# Patient Record
Sex: Male | Born: 1960 | Race: White | Hispanic: No | Marital: Married | Smoking: Current every day smoker
Health system: Southern US, Community
[De-identification: ages and names within clinical notes are randomized; demographics above are authoritative.]

## PROBLEM LIST (undated history)

## (undated) HISTORY — PX: LUMBAR FUSION: SHX111

## (undated) HISTORY — PX: MULTIPLE TOOTH EXTRACTIONS: SHX2053

---

## 2002-11-27 ENCOUNTER — Encounter: Payer: Self-pay | Admitting: Specialist

## 2002-11-27 ENCOUNTER — Ambulatory Visit (HOSPITAL_COMMUNITY): Admission: RE | Admit: 2002-11-27 | Discharge: 2002-11-27 | Payer: Self-pay | Admitting: Specialist

## 2007-09-18 ENCOUNTER — Ambulatory Visit (HOSPITAL_COMMUNITY): Admission: RE | Admit: 2007-09-18 | Discharge: 2007-09-18 | Payer: Self-pay | Admitting: Orthopedic Surgery

## 2007-09-18 ENCOUNTER — Encounter (INDEPENDENT_AMBULATORY_CARE_PROVIDER_SITE_OTHER): Payer: Self-pay | Admitting: Orthopedic Surgery

## 2008-08-03 ENCOUNTER — Encounter: Admission: RE | Admit: 2008-08-03 | Discharge: 2008-08-03 | Payer: Self-pay | Admitting: Specialist

## 2008-11-17 ENCOUNTER — Inpatient Hospital Stay (HOSPITAL_COMMUNITY): Admission: RE | Admit: 2008-11-17 | Discharge: 2008-11-19 | Payer: Self-pay | Admitting: Specialist

## 2009-03-25 ENCOUNTER — Encounter: Admission: RE | Admit: 2009-03-25 | Discharge: 2009-03-25 | Payer: Self-pay | Admitting: Specialist

## 2009-04-07 ENCOUNTER — Encounter (INDEPENDENT_AMBULATORY_CARE_PROVIDER_SITE_OTHER): Payer: Self-pay | Admitting: General Surgery

## 2009-04-07 ENCOUNTER — Ambulatory Visit (HOSPITAL_COMMUNITY): Admission: RE | Admit: 2009-04-07 | Discharge: 2009-04-07 | Payer: Self-pay | Admitting: General Surgery

## 2009-08-03 ENCOUNTER — Encounter: Admission: RE | Admit: 2009-08-03 | Discharge: 2009-08-03 | Payer: Self-pay | Admitting: Specialist

## 2009-12-06 IMAGING — RF DG LUMBAR SPINE 2-3V
1 series · 2 of 2 positions shown · non-contrast
Comparison: Preoperative radiographs 11/12/2008.

CLINICAL DATA: L4-L5 PLIF.

LUMBAR SPINE - 2-3 VIEW

[Series 1: run · 2 of 2 slices shown]
[im 1/2]
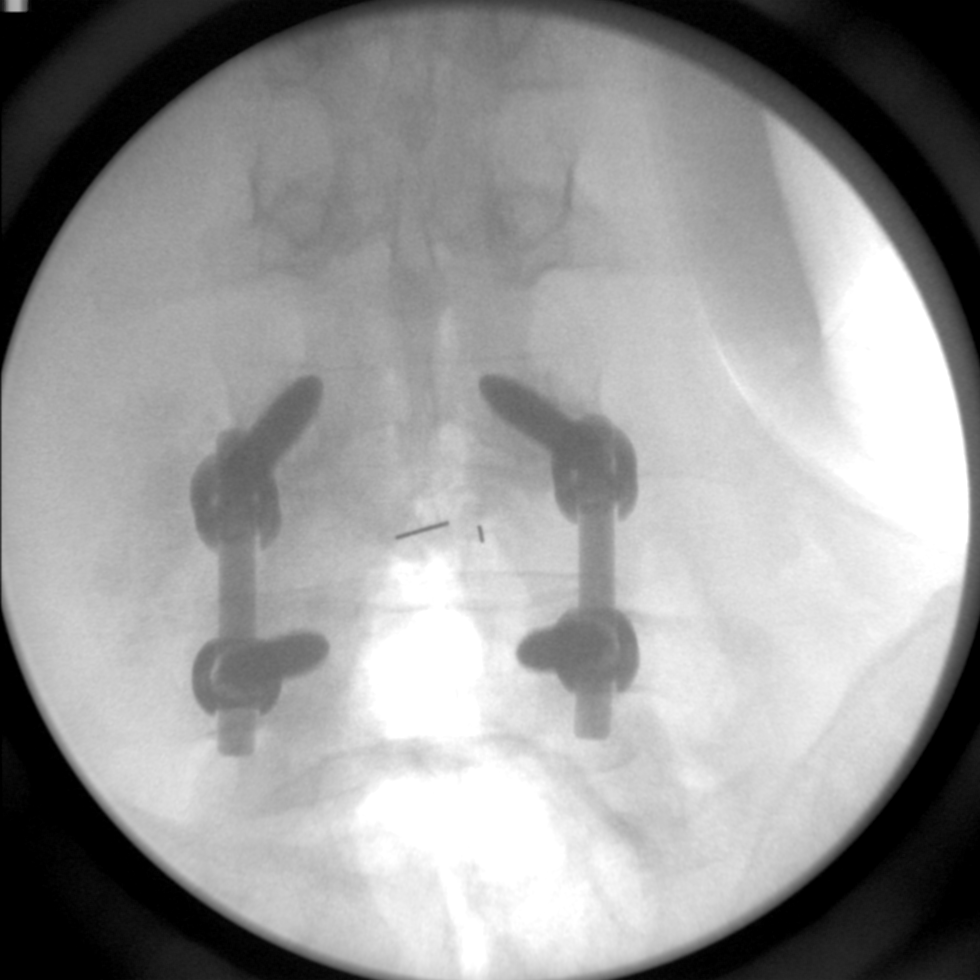
[im 2/2]
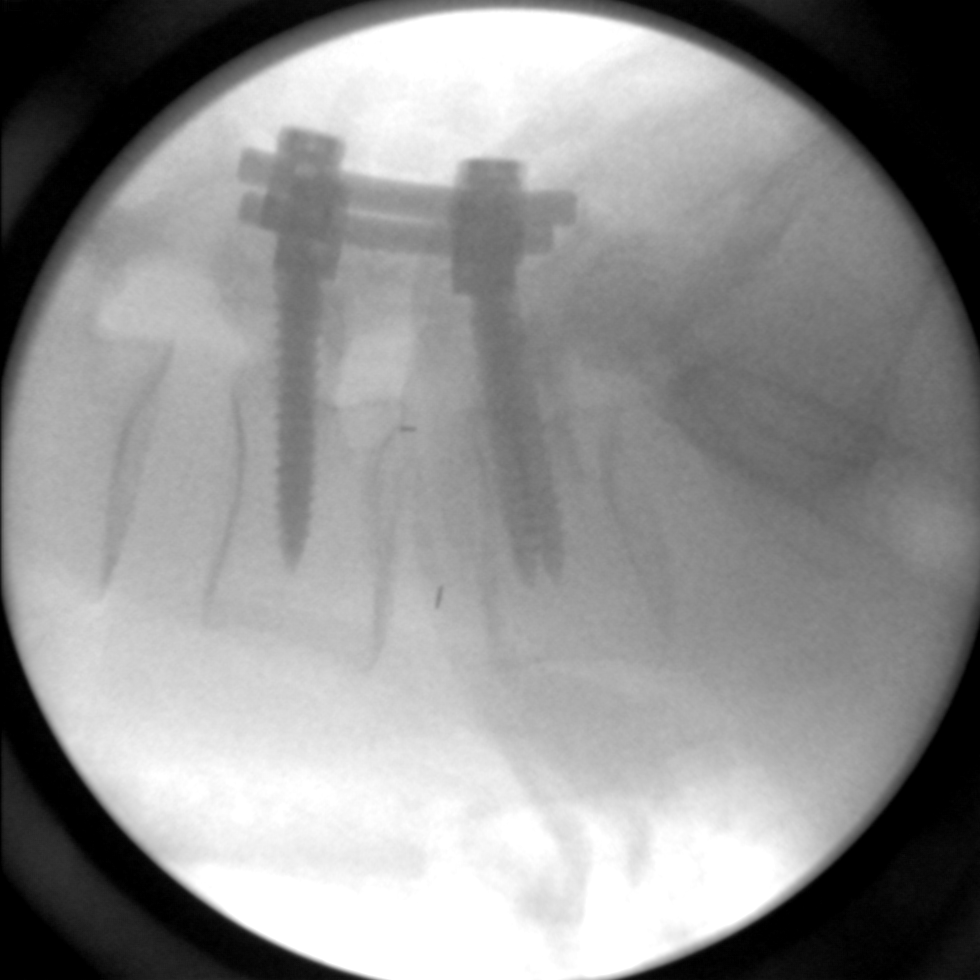

[2 of 2 positions shown; findings below may reference images not displayed]

FINDINGS: Spot PA and lateral fluoroscopic images are submitted
from the operating room.  These demonstrate posterior lumbar and
interbody fusion at L4-L5 with bilateral pedicle screws,
interconnecting rods and interbody spacers.  The hardware appears
well positioned.
IMPRESSION: Intraoperative views following L4-L5 PLIF.  No demonstrated
complication.

## 2010-12-09 LAB — DIFFERENTIAL
Basophils Absolute: 0 10*3/uL (ref 0.0–0.1)
Basophils Relative: 0 % (ref 0–1)
Neutro Abs: 5.9 10*3/uL (ref 1.7–7.7)
Neutrophils Relative %: 67 % (ref 43–77)

## 2010-12-09 LAB — BASIC METABOLIC PANEL
CO2: 23 mEq/L (ref 19–32)
Calcium: 9.6 mg/dL (ref 8.4–10.5)
Creatinine, Ser: 0.94 mg/dL (ref 0.4–1.5)
GFR calc Af Amer: >60 mL/min (ref >60)
GFR calc non Af Amer: >60 mL/min (ref >60)
Glucose, Bld: 112 mg/dL — ABNORMAL HIGH (ref 70–99)

## 2010-12-09 LAB — CBC
MCHC: 33.9 g/dL (ref 30.0–36.0)
RDW: 14.4 % (ref 11.5–15.5)

## 2010-12-14 LAB — HEMOGLOBIN AND HEMATOCRIT, BLOOD
HCT: 31.6 % — ABNORMAL LOW (ref 39.0–52.0)
HCT: 35.6 % — ABNORMAL LOW (ref 39.0–52.0)
Hemoglobin: 11.1 g/dL — ABNORMAL LOW (ref 13.0–17.0)
Hemoglobin: 12.6 g/dL — ABNORMAL LOW (ref 13.0–17.0)

## 2010-12-14 LAB — COMPREHENSIVE METABOLIC PANEL
ALT: 35 U/L (ref 0–53)
AST: 34 U/L (ref 0–37)
CO2: 28 mEq/L (ref 19–32)
Calcium: 9.6 mg/dL (ref 8.4–10.5)
Chloride: 102 mEq/L (ref 96–112)
GFR calc Af Amer: >60 mL/min (ref >60)
GFR calc non Af Amer: >60 mL/min (ref >60)
Sodium: 136 mEq/L (ref 135–145)

## 2010-12-14 LAB — GLUCOSE, CAPILLARY: Glucose-Capillary: 100 mg/dL — ABNORMAL HIGH (ref 70–99)

## 2010-12-14 LAB — DIFFERENTIAL
Eosinophils Absolute: 0.2 10*3/uL (ref 0.0–0.7)
Eosinophils Relative: 3 % (ref 0–5)
Lymphs Abs: 1.9 10*3/uL (ref 0.7–4.0)
Monocytes Relative: 8 % (ref 3–12)

## 2010-12-14 LAB — URINALYSIS, ROUTINE W REFLEX MICROSCOPIC
Glucose, UA: NEGATIVE mg/dL
Hgb urine dipstick: NEGATIVE
Specific Gravity, Urine: 1.009 (ref 1.005–1.030)
pH: 7 (ref 5.0–8.0)

## 2010-12-14 LAB — PROTIME-INR: Prothrombin Time: 13.4 seconds (ref 11.6–15.2)

## 2010-12-14 LAB — ABO/RH: ABO/RH(D): AB POS

## 2010-12-14 LAB — CBC
MCHC: 35.5 g/dL (ref 30.0–36.0)
RBC: 4.94 MIL/uL (ref 4.22–5.81)
WBC: 6.6 10*3/uL (ref 4.0–10.5)

## 2010-12-14 LAB — TYPE AND SCREEN
ABO/RH(D): AB POS
Antibody Screen: NEGATIVE

## 2011-01-16 NOTE — Op Note (Signed)
[name], [name]              ACCOUNT NO.:  [id number]   MEDICAL RECORD NO.:  [id number]          PATIENT TYPE:  AMB   LOCATION:  DAY                          FACILITY:  [facility]   PHYSICIAN:  Marlowe Kays, M.D.  DATE OF BIRTH:  1961-08-08   DATE OF PROCEDURE:  09/18/2007  DATE OF DISCHARGE:                               OPERATIVE REPORT   PREOPERATIVE DIAGNOSIS:  Herniated nucleus pulposus L4-L5, left.   POSTOPERATIVE DIAGNOSIS:  1. Herniated nucleus pulposus.  2. Lateral recess stenosis L4-L5, left.   OPERATION:  Microdiscectomy with lateral recess decompression, L4-L5,  left.   SURGEON:  Marlowe Kays, M.D.   ASSISTANT:  Georges Lynch. Darrelyn Hillock, M.D.   ANESTHESIA:  General.   JUSTIFICATION FOR PROCEDURE:  He has had an on the job injury with  persistent left leg pain.  MRI has demonstrated left lateral disc  herniation.  He has failed to respond to non-surgical treatment.  Consequently, he is here today for the above mentioned surgery.  See  description below for additional details.   DESCRIPTION OF PROCEDURE:  Prophylactic antibiotics, satisfactory  general anesthesia, knee chest position on the [place] frame.  The back  was prepped with DuraPrep and with two spinal needles, a lateral x-ray  tentatively localized the L4-L5.  We then completed draping the back in  a sterile field, Ioban employed.  Vertical midline incision centered at  where we thought L4-L5 lay.  I tagged the two spinous processes at this  level with Kocher clamps and took a second lateral x-ray confirming that  these clamps were on L4 and L5 of the L4-L5 interspace midway between.  I then began dissecting soft tissue off the neural arches of L4 and L5  and placed a self-retaining retractor.  With a small curette, I  undermined the inferior portion of L4 and superior portion of L5 and  then followed this with 3 mm Kerrison rongeurs removing bone and  ligamentum flavum.  He was quite tight laterally.   We brought in the  microscope to complete the decompression.  Most of this compression  appeared to be bone centered at the L4-L5 facet.  We removed as much  bone as was needed in order to get lateral to the L5 nerve root which we  were able to mobilize medially.  A large disc herniation was noted at  the expected level.  After coagulating associated veins with bipolar  cautery, I opened the disc with a 15 knife blade and then removed a  large amount of degenerated disc material, much of which was in large  chunks.  We removed all disc material to the left and centrally with a  combination of straight and angled upbiting pituitaries, also used  Epstein curette and nerve hook.  At the conclusion of the case, his L5  nerve root was freely mobile and the L4-L5 foramen was widely patent.  There was no unusual bleeding.  I irrigated the wound well with sterile  saline and placed Gelfoam soaked in thrombin over the interspace about  the nerve and dura.  Self-retaining retractors were removed.  There was  no unusual bleeding.  The wound was  then closed in layers with interrupted #1 Vicryl in the fascia, 2-0  Vicryl in the subcutaneous tissues, and staples in the skin.  Betadine  and Adaptic dry, sterile dressing was applied.  He tolerated the  procedure well and was taken to the recovery room in satisfactory  condition with no known complications.           ______________________________  Marlowe Kays, M.D.     JA/MEDQ  D:  09/18/2007  T:  09/18/2007  Job:  [id number]

## 2011-01-16 NOTE — H&P (Signed)
NAMESEVERUS, BRODZINSKI              ACCOUNT NO.:  [id number]   MEDICAL RECORD NO.:  [id number]           PATIENT TYPE:   LOCATION:                                 FACILITY:   PHYSICIAN:  Jene Every, M.D.    DATE OF BIRTH:  Nov 04, 1960   DATE OF ADMISSION:  DATE OF DISCHARGE:                              HISTORY & PHYSICAL   PRIMARY CARE Jaima Janney:  Dr. Feliciana Rossetti.   CHIEF COMPLAINT:  Back and left lower extremity pain.   HISTORY:  Mr. Denardo is a pleasant 50 year old gentleman who initially  was seen in our office in 2008 by Dr. Marlowe Kays secondary to a  work-related injury.  He sustained a disk herniation.  He underwent  surgical decompression by Dr. Simonne Come in January 2009.  The patient  noted no relief from the surgical intervention.  He noted persisting and  continued lower extremity pain as well as development of a back pain.  He underwent selective nerve root block with no relief.  He had seen Dr.  Otelia Sergeant in the past for a second opinion who recommended decompression  with TLIF.  He was then referred to Dr. Shelle Iron for final evaluation.  The  patient underwent continued conservative treatment, but noted persistent  disabling pain.  CT myelogram does reveal recurrent disk herniation at 4-  5, more pronounced in the left with compression of the L5 nerve root.  It was felt at this point that the patient has failed conservative  treatment and continues to have persistent disabling pain that he would  benefit from a lumbar decompression, transluminal interbody fusion,  posterior fusion at 4-5.  The risks and benefits of the surgery were  discussed with the patient.  He does wish to proceed.   MEDICAL HISTORY:  Significant for hypertension, hypercholesteremia,  anxiety, gastroesophageal reflux disease, history of pancreatitis.   CURRENT MEDICATIONS:  1. Atenolol 100 mg p.o. daily.  2. Prilosec 20 mg 1 p.o. daily.  3. Hydrocodone 10 mg 3-4 times a day.  4. Fenofibrate  134 mg capsules 1 p.o. daily.  5. Lipitor 20 mg 1 p.o. daily.  6. Xanax 0.5 mg half tab p.o. nightly p.r.n.  7. Advil p.r.n.  8. Claritin 10 mg p.o. daily.   The patient is currently using the nicotine patch.   ALLERGIES:  PENICILLIN and PREDNISONE which causes rash with difficulty  breathing.   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  He  smoked 2 packs a day for 25 years.  He has stopped smoking approximately  3 weeks ago with utilization of the patch.  In regard to alcohol  consumption, he drinks 2-6 beers per day.   PREVIOUS SURGERIES:  Microdiskectomy in 2009 by Dr. Simonne Come.   FAMILY HISTORY:  Father deceased of motor vehicle accident.  Mother is  living, history of asthma.   REVIEW OF SYSTEMS:  GENERAL:  The patient denies any fever, chills,  night sweats, or bleeding tendencies.  CNS:  No blurred or double  vision, seizure, headache, or paralysis.  RESPIRATORY:  The patient does  note  occasional wheezing, some sinus drainage.  No chest pain or  shortness of breath.  GU:  No dysuria, hematuria, discharge.  GI:  No  nausea, vomiting, diarrhea, constipation, melena, or bloody stools.  MUSCULOSKELETAL:  As per the HPI.   PHYSICAL EXAMINATION:  VITAL SIGNS:  Height is 6 feet 2 inches, weight  220, pulse is 60, respiratory 10, BP 126/80.  GENERAL:  This is a well-nourished gentleman, sitting upright in mild  distress.  He does have discomfort getting up from a seated position.  HEENT:  Atraumatic and normocephalic.  Pupils equal, round, and reactive  to light.  EOMs intact.  NECK:  Supple.  No lymphadenopathy.  CHEST:  The patient does have wheezes in bilateral bases.  HEART:  Regular rate and rhythm without murmurs, gallops, or rubs.  ABDOMEN:  Soft, nontender, nondistended.  Bowel sounds x4.  SKIN:  No rashes or lesions are noted.  BACK:  The patient has pain with forward flexion and extension of the  lumbar spine.  He is tender at the lumbosacral  junction.  Straight leg  raise on the left does produce some low back, buttock, and posterior  thigh pain.  EHL is 5-/5 and slightly decreased sensation along the L5  dermatome.   IMPRESSION:  Degenerative disk disease at L4-5 with recurrent herniated  nucleus pulposus.   PLAN:  The patient will be admitted to [facility] to undergo  decompression of 4-5, TLIF with pedicle screw instrumentation for  posterior fusion.      Roma Schanz, P.A.      Jene Every, M.D.  Electronically Signed    CS/MEDQ  D:  11/15/2008  T:  11/16/2008  Job:  [id number]

## 2011-01-16 NOTE — Op Note (Signed)
[name], [name]              ACCOUNT NO.:  [id number]   MEDICAL RECORD NO.:  [id number]          PATIENT TYPE:  AMB   LOCATION:  DAY                          FACILITY:  [facility]   PHYSICIAN:  Juanetta Gosling, MDDATE OF BIRTH:  06/05/1961   DATE OF PROCEDURE:  04/07/2009  DATE OF DISCHARGE:                               OPERATIVE REPORT   PREOPERATIVE DIAGNOSES:  Left breast mass.   POSTOPERATIVE DIAGNOSES:  Left breast mass.   PROCEDURE:  Left breast mass excisional biopsy.   SURGEON:  Dr. Harden Mo.   ASSISTANT:  None.   ANESTHESIA:  General.   SPECIMEN:  Left breast mass, to pathology.   ESTIMATED BLOOD LOSS:  Minimal.   COMPLICATIONS:  None.   DRAINS:  None.   SUPERVISING ANESTHESIOLOGIST:  Dr. Brayton Caves.   INDICATIONS:  This is a 50 year old male with about a two-month history  of a left breast/ chest wall mass that was noted on examination,  occasionally tender, with no real change in size.  This occurred after  he underwent a prone back surgery, and is somewhat temporally related to  that.  He has no other history of trauma in this area.  He underwent a  mammogram and ultrasound, read by Dr. Gordan Payment, that really was  normal, but there certainly on his examination is a palpable mass that  is either in his left breast, or on his chest wall.  He and I discussed  excisional biopsy of this.   DESCRIPTION OF PROCEDURE:  After an informed consent was given, the  patient was taken to the operating room.  He was administered 400 mg of  IV ciprofloxacin due to a PENICILLIN allergy.  Sequential compression  devices were placed on his lower extremities prior to operation.  He  then underwent a general anesthesia without complication.  His left  breast was prepped and draped in the standard sterile surgical fashion.  A surgical time-out was then performed.   About a 4 cm incision was then made overlying the mass in a radial  fashion.  Dissection was  carried out down to the level of the mass,  which was excised in its entirety.  I was unable to really determine  what this mass was in this man, of just being breast tissue.  Hemostasis  was obtained.  The deep layer was closed with 3-0 Vicryl.  The dermis  was closed with 4-0 Vicryl and the skin was closed with 4-0 Monocryl.  Steri-Strips and Dermabond were then placed over this wound.   He tolerated this well and was extubated in the operating room and was  transferred to the recovery room in stable condition.      Juanetta Gosling, MD  Electronically Signed     MCW/MEDQ  D:  04/07/2009  T:  04/07/2009  Job:  [id number]   cc:   Feliciana Rossetti, MD  Fax: [phone]

## 2011-01-16 NOTE — Op Note (Signed)
[name], [name]              ACCOUNT NO.:  [id number]   MEDICAL RECORD NO.:  [id number]          PATIENT TYPE:  INP   LOCATION:  5025                         FACILITY:  [facility]   PHYSICIAN:  Jene Every, M.D.    DATE OF BIRTH:  [birth year]   DATE OF PROCEDURE:  DATE OF DISCHARGE:                               OPERATIVE REPORT   PREOPERATIVE DIAGNOSIS:  Recurrent disk herniation, degenerative disk  disease, L4-5.   POSTOPERATIVE DIAGNOSIS:  Recurrent disk herniation, degenerative disk  disease, L4-5.   PROCEDURE PERFORMED:  1. Redo decompression, L4-5; central laminectomy; lateral recess      decompression; microdiskectomy.  2. TLIF at L4-5 utilizing a 12 Stryker Bulleted spacer with autologous      and allograft bone graft.  3. Posterior pedicle screw and rod instrumentation.  4. Lateral mass fusion, L4-5 utilizing autologous and      ___allograft_______ bone graft.  5. Continuous EMG neuromonitoring, 6 hours.   BRIEF HISTORY:  A 50 year old with recurrent disk herniation, severe  back pain, left lower extremity radicular pain, myelogram indicating  defect in the left compressing the root, a fairly tall disk with severe  back pain.  It was indicated for redo decompression and fusion.  Risks  and benefits discussed including bleeding, infection, damage to  neurovascular structures, CSF leak, and anesthetic complications.   TECHNIQUE:  The patient in the supine position.  After induction of  adequate anesthesia, 1 g vancomycin , he was placed prone on the Stryker  spine frame.  All bony prominences were well padded.  Lumbar region was  prepped and draped in usual sterile fashion.  Incision was made from the  spinous process at L3-S1.  Subcutaneous tissues were dissected  __________ dorsolumbar fascia, identified the bilateral skin incisions,  paraspinous muscle __________ at L3-4 preserving the facet at L3-4 and  L5-S1.  We identified the spinous process of L4 and L5.  The  facets of  L4 and L5 were skeletonized and de-capsulated near the interspinous  ligament and partial of the spinous process of L4 and L5.  With an  osteotome, we removed the inferior lamina of L4.  We removed it and  detached it from the epidural fibrosis by utilizing a curette.  This was  taken up to the pars at L4.  We removed the whole inferior facet on that  left side.  Then, using an osteotome, we then removed the dura  articulating a portion of L5.  We then undercut this with a 3-mm  Kerrison to the cephalad border of the pedicle and then to the medial  border of the pedicle. We used an operating microscope, draped it, and  brought into the surgical field.  We meticulously detached the epidural  fibrosis from the lateral recess, decompressed the lateral recess from  the medial border of the pedicle.  Recurrent disk herniations was noted  within the lateral recess.  It was removed with a straight pituitary.  Annulotomy was performed.  Copious amounts of disk material was removed  from the disk space and was straightened up by pituitary.  We protected  the L4 nerve root at all time.  He had continuous neuromonitoring  throughout the case.  There was no activity noted throughout the case.  On the left, a covering of soft fat tissue over the L4 nerve root and  protected the thecal sac in the nerve root at all times.   Next, we performed a full diskectomy at L4-5, curetted the endplates.  The endplates were preserved, curetted the cartilage only, and performed  the full diskectomy and irrigated the wound.  The bone graft that had  been removed was saved for autologous bone grafting.  Bone graft was  then placed in the anterior part of the disk space.  We then  sequentially trailed a Stryker PEEK spacer.  It was 12 mm in width, 30  in length.  This was found to be optimal, and this was paced with  Actifuse and cancellous bone graft.  With the neural elements well  protected, we impacted  the spacer, and the AP and lateral plane was  found to be satisfactory with a good fit.  This was countersunk to the  posterior vertebral body and nerve roots at L4 and L5.  They were widely  patent.  Bipolar electrocautery was utilized to achieve hemostasis with  thrombin-soaked Gelfoam, and at that point, bone wax.   Next, we turned our attention to placing the pedicle screws.  We  identified the transverse processes of L4 and L5 and the lateral recess  with a bone graft laterally.  Starting at L4 on the right hand side at a  point at the base of the TP and facet under x-ray, starting hole was  made and then converted with the pedicle finder.  This was observed in  AP and lateral plane and then felt __________ to be within bone.  This  was tapped and then a 45, 6.5 screw was then inserted with excellent  purchase.  This was after we curetted the TP and the pars on the side of  TP, L4 and L5.  At first, we placed on the L5 in a similar fashion,  found the pedicle, inserted the screw, placed a rod; however, the x-ray  in the AP felt it was divergent.  We removed the rod and that screw and  felt that it may have traversed out lateral to the pedicle screws.  This  was redirected and converged with excellent purchase and probing of the  ball tip, and an AP and lateral plane with an excellent purchase noted  with a 45-mm 6.75 screw.  These were tested with a pedicle testing, and  both were satisfactory, the 4 in the right at 27, the 5 on the right at  34.  A single rod was then placed between the two after next cancellous  bone graft and Actifuse placed out in the lateral recess.  On the left,  where the TLIF had been formed, we again found the pedicles at L4 and L5  by the intersection of the TP and the facet and visualized on the x-ray  again with a pedicle probe converging using C-arm augmentation. The  probe was then boned throughout and tapped it.  Then, using 6.75 screw  at the fore-end  of L5 with excellent purchase.  We used hockey stick  probe at the perimeter of L4 and L5, they were found to be widely  patent.  We then compressed the disk space on that side and used a 55  curve rod and locked  it into the pedicle screws with a set and torqued  it appropriately.  This was done on the right as well compressing disk  space.   Wound was copiously irrigated again.  Inspection of CSF leakage and a  stick probe placed at the foramen of L4-L5 and found to be widely  patent.  In the AP and lateral plane, excellent convergence.  Good  placement of the screws as well, and the spacer was satisfactory as  well.   Next, again the wound was copiously irrigated with. We packed more  Actifuse and bone graft held on the right lateral recess.  We then  placed a Hemovac and brought out through a lateral stab wound to the  skin.  We then repaired the dorsolumbar fascia with #1 Vicryl in figure-  of-eight sutures.  Subcutaneous tissue were reapproximated with 2-0  Vicryl, and skin was reapproximated with staples.  We tested a drain and  it was __________ without difficulty.  Wound was dressed with Adaptic, 4  x 4's.  The wound was dressed sterilely, and the patient was placed  supine on the hospital bed, extubated without difficulty, and  transported to the recovery room in satisfactory condition.   The patient tolerated the procedure well.  There were no complications.  Blood loss was 1000 mL.  Urine output was 350, received that through  cell saver.   Technical difficulty was increased due to the extensive epidural  fibrosis on the left, __________ a meticulous thorough decompression as  well as redirecting the pedicle screw on the right.  Also, had a fairly  tall disk at L4-5 as well.   Dr. Shon Baton was the assistant as well as Roma Schanz, PA.     Jene Every, M.D.  Electronically Signed    JB/MEDQ  D:  11/17/2008  T:  11/18/2008  Job:  [id number]

## 2011-01-19 NOTE — Discharge Summary (Signed)
[name], [name]              ACCOUNT NO.:  [id number]   MEDICAL RECORD NO.:  [id number]          PATIENT TYPE:  INP   LOCATION:  5025                         FACILITY:  [facility]   PHYSICIAN:  Jene Every, M.D.    DATE OF BIRTH:  [birth year]   DATE OF ADMISSION:  11/17/2008  DATE OF DISCHARGE:  11/19/2008                               DISCHARGE SUMMARY   ADMISSION DIAGNOSES:  1. 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  2. Hypertension.  3. Hypercholesteremia.  4. Anxiety.  5. Gastroesophageal reflux disease.  6. History of pancreatitis.   DISCHARGE DIAGNOSES:  1. 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  2. Hypertension.  3. Hypercholesteremia.  4. Anxiety.  5. Gastroesophageal reflux disease.  6. History of pancreatitis.  7. Status post redo decompression with posterolateral fusion,      transforaminal lumbar interbody fusion.  8. Alcohol withdrawal.   HISTORY:  [name] is a pleasant 50 year old gentleman, who first  presented to our office in 2008.  He was evaluated by Dr. Simonne Come for  a work-related injury.  Following that injury, he underwent surgical  decompression by Dr. Simonne Come for a HNP.  He did well for quite some  time, but then noted recurrent lower extremity pain.  He underwent  selective nerve root block with no relief of his symptoms.  He did go  for a second opinion with Dr. Otelia Sergeant, who recommended decompression with  TLIF.  He was then referred to Dr. Shelle Iron for definitive treatment.  Dr.  Shelle Iron did feel that with recurrent disk herniation and progressive back  pain, the patient would benefit from a decompression with translumbar  interbody fusion at L4-5.  The risks and benefits of the surgery were  discussed with the patient.  He does elect to proceed.   PROCEDURE:  The patient was taken to the OR on November 17, 2008, underwent  a redo decompression at L4-5 with trans lumbar  interbody fusion,  posterolateral fusion using pedicle screw instrumentation.   SURGEON:  Jene Every, MD   ASSISTANTS:  1. Roma Schanz, PA-C  2. Dahari D. Shon Baton, MD   COMPLICATIONS:  None.   ANESTHESIA:  General.   LABORATORY DATA:  Preoperative CBC shows white cell count 6.6,  hemoglobin 16.1, and hematocrit 45.5.  H&H is followed throughout the  hospital course, slightly decreased, but stable.  At the time of  discharge, hemoglobin is 11.1 and hematocrit 31.6.  Coagulation studies  done preoperatively were within normal range.  Routine chemistries done  preoperatively showed normal sodium at 136, potassium 4.8 with a glucose  of 113.  Routine liver function tests were within normal range.  Preoperative urinalysis was negative for any infection.  Blood type AB  positive.  Preop EKG showed T-wave abnormality, question lateral  ischemia.  I do not see a preoperative chest x-ray in the chart.   HOSPITAL COURSE:  The patient was admitted, taken to the OR, and  underwent the above-stated procedure without difficulty.  One Hemovac  drain was  placed intraoperatively.  The patient was transferred to the  PACU and then to the orthopedic floor for continued postoperative care.  He was placed on PCA analgesics for pain relief.  On postop day #1, the  patient did have difficulty overnight secondary to some anxiety,  question withdrawal symptoms from alcohol.  The patient had been out of  bed with minimal difficulty, he was passing flatus.  Pain was fairly  well controlled with PCA.  Vital signs were stable.  He was afebrile.  Hemoglobin was stable at 12.1.  We did remove the Hemovac with tip  intact.  Dressing was clean and dry.  Postop day #1, we discontinued the  Foley, weaned the patient off the PCA.  PT/OT was consulted.  We did  discontinue the Ativan at this time and ordered spirits of choice with  meals.  The patient will continue with nicotine patches.  We did adjust  his  pain medication from Percocet to Vicodin.  We did slowly advance his  diet and was placed on ileus precautions.  The patient did well with  physical therapy.  Unfortunately, on postop day #2, he continued to note  worsening agitation and anxiety for being in the hospitalization.  He  states he was not getting sleep.  He was anxious to be discharged.  He  did have a T-max of 100.5.  Vital signs remained stable.  Dressing was  changed.  Incision was clean and dry.  Motor neurovascular function was  intact to the lower extremity.  Calves were soft and nontender.  Abdomen  was soft and nontender.  We did discontinue the PCA as well as O2.  We  did slowly advance his diet.  Continue with Dulcolax.  Incentive  spirometer was encouraged.  We did receive a call later that day from  the patient.  Again, he was having a quite a bit of anxiety and was  ready to be discharged.  The patient was afebrile.  He had had a bowel  movement, pain was well controlled on p.o. analgesics.  Therefore, we  did elect to discharge him at that point.   DISPOSITION:  The patient was discharged home with home health PT and  OT.  He is to follow up with Dr. Shelle Iron in approximately 10-14 days for x-  ray as well as suture removal.   ACTIVITY:  He is to walk as tolerated utilizing his brace.  We discussed  back precautions.   DIET:  High fiber.   DISCHARGE MEDICATIONS:  1. Vitamin C 500 mg daily.  2. Norco 10/325 one to two p.o. q.4-6 h. p.r.n. pain.  3. Robaxin 1 p.o. q.6-8 h. p.r.n. spasm.  4. Aspirin 325 mg daily.   The patient is to not smoke.  We discussed signs of symptoms of DVT and  PE.  He is aware of this.   CONDITION ON DISCHARGE:  Stable.   FINAL DIAGNOSIS:  Doing well, status post posterior lumbar fusion at L4-  5.      Roma Schanz, P.A.      Jene Every, M.D.  Electronically Signed    CS/MEDQ  D:  12/18/2008  T:  12/18/2008  Job:  [id number]

## 2011-05-24 LAB — BASIC METABOLIC PANEL
Calcium: 9
Chloride: 103
Creatinine, Ser: 0.95
GFR calc Af Amer: >60
Sodium: 136

## 2011-05-24 LAB — HEMOGLOBIN AND HEMATOCRIT, BLOOD
HCT: 44.1
Hemoglobin: 15.6

## 2011-06-28 ENCOUNTER — Other Ambulatory Visit: Payer: Self-pay | Admitting: Gastroenterology

## 2011-06-28 DIAGNOSIS — R634 Abnormal weight loss: Secondary | ICD-10-CM

## 2011-07-03 ENCOUNTER — Ambulatory Visit
Admission: RE | Admit: 2011-07-03 | Discharge: 2011-07-03 | Disposition: A | Discharge status: Home / Self Care | Payer: BC Managed Care – PPO | Source: Ambulatory Visit | Attending: Gastroenterology | Admitting: Gastroenterology

## 2011-07-03 DIAGNOSIS — R634 Abnormal weight loss: Secondary | ICD-10-CM

## 2011-07-03 MED ORDER — IOHEXOL 300 MG/ML  SOLN
100.0000 mL | Freq: Once | INTRAMUSCULAR | Status: AC | PRN
  Administered 2011-07-03: 100 mL via INTRAVENOUS

## 2014-08-17 ENCOUNTER — Other Ambulatory Visit (HOSPITAL_COMMUNITY): Payer: Self-pay | Admitting: Oncology

## 2014-08-17 DIAGNOSIS — C8332 Diffuse large B-cell lymphoma, intrathoracic lymph nodes: Secondary | ICD-10-CM

## 2014-08-25 ENCOUNTER — Ambulatory Visit (HOSPITAL_COMMUNITY): Payer: Medicare Other

## 2014-08-25 ENCOUNTER — Encounter (HOSPITAL_COMMUNITY)
Admission: RE | Admit: 2014-08-25 | Discharge: 2014-08-25 | Disposition: A | Discharge status: Home / Self Care | Payer: Medicare Other | Source: Ambulatory Visit | Attending: Oncology | Admitting: Oncology

## 2014-08-25 DIAGNOSIS — C8332 Diffuse large B-cell lymphoma, intrathoracic lymph nodes: Secondary | ICD-10-CM

## 2014-08-25 LAB — GLUCOSE, CAPILLARY: Glucose-Capillary: 109 mg/dL — ABNORMAL HIGH (ref 70–99)

## 2014-08-25 MED ORDER — FLUDEOXYGLUCOSE F - 18 (FDG) INJECTION
11.1000 | Freq: Once | INTRAVENOUS | Status: AC | PRN
  Administered 2014-08-25: 11.1 via INTRAVENOUS

## 2015-11-16 DIAGNOSIS — Z8572 Personal history of non-Hodgkin lymphomas: Secondary | ICD-10-CM | POA: Insufficient documentation

## 2016-04-12 DIAGNOSIS — C8332 Diffuse large B-cell lymphoma, intrathoracic lymph nodes: Secondary | ICD-10-CM | POA: Diagnosis not present

## 2016-08-16 DIAGNOSIS — C8332 Diffuse large B-cell lymphoma, intrathoracic lymph nodes: Secondary | ICD-10-CM | POA: Diagnosis not present

## 2016-10-23 DIAGNOSIS — C8332 Diffuse large B-cell lymphoma, intrathoracic lymph nodes: Secondary | ICD-10-CM | POA: Diagnosis not present

## 2016-12-14 DIAGNOSIS — Z8572 Personal history of non-Hodgkin lymphomas: Secondary | ICD-10-CM | POA: Diagnosis not present

## 2017-06-18 DIAGNOSIS — Z8572 Personal history of non-Hodgkin lymphomas: Secondary | ICD-10-CM | POA: Diagnosis not present

## 2018-02-11 DIAGNOSIS — Z8572 Personal history of non-Hodgkin lymphomas: Secondary | ICD-10-CM | POA: Diagnosis not present

## 2018-08-21 DIAGNOSIS — D696 Thrombocytopenia, unspecified: Secondary | ICD-10-CM | POA: Diagnosis not present

## 2018-08-21 DIAGNOSIS — Z7289 Other problems related to lifestyle: Secondary | ICD-10-CM | POA: Diagnosis not present

## 2018-08-21 DIAGNOSIS — C8332 Diffuse large B-cell lymphoma, intrathoracic lymph nodes: Secondary | ICD-10-CM

## 2018-08-21 DIAGNOSIS — R740 Nonspecific elevation of levels of transaminase and lactic acid dehydrogenase [LDH]: Secondary | ICD-10-CM

## 2019-02-27 DIAGNOSIS — C8332 Diffuse large B-cell lymphoma, intrathoracic lymph nodes: Secondary | ICD-10-CM | POA: Diagnosis not present

## 2019-09-08 DIAGNOSIS — C8332 Diffuse large B-cell lymphoma, intrathoracic lymph nodes: Secondary | ICD-10-CM

## 2020-04-07 DIAGNOSIS — C8332 Diffuse large B-cell lymphoma, intrathoracic lymph nodes: Secondary | ICD-10-CM

## 2022-07-24 ENCOUNTER — Other Ambulatory Visit: Payer: Self-pay

## 2022-07-24 DIAGNOSIS — R634 Abnormal weight loss: Secondary | ICD-10-CM

## 2022-07-24 DIAGNOSIS — Z8572 Personal history of non-Hodgkin lymphomas: Secondary | ICD-10-CM

## 2022-07-25 ENCOUNTER — Other Ambulatory Visit: Payer: Self-pay | Admitting: Gastroenterology

## 2022-07-25 DIAGNOSIS — R1310 Dysphagia, unspecified: Secondary | ICD-10-CM

## 2022-07-31 ENCOUNTER — Ambulatory Visit
Admission: RE | Admit: 2022-07-31 | Discharge: 2022-07-31 | Disposition: A | Discharge status: Home / Self Care | Payer: Medicare Other | Source: Ambulatory Visit | Attending: Gastroenterology | Admitting: Gastroenterology

## 2022-07-31 DIAGNOSIS — R1310 Dysphagia, unspecified: Secondary | ICD-10-CM

## 2022-08-02 ENCOUNTER — Telehealth: Payer: Self-pay

## 2022-08-02 NOTE — Telephone Encounter (Signed)
Patinet's spouse (annette) LM stating Mr. Huy may have possible Esophageal Malignancy.  Called patient back and he informed me that BX was done today @ [facility] in [place].  He does NOT want any scans done until tissue DX comes back and he gets to talk to Dr. Bobby Rumpf.  I will watch for DX and then get patient on schedule to f/u with Dr. Bobby Rumpf.

## 2022-08-09 ENCOUNTER — Inpatient Hospital Stay (INDEPENDENT_AMBULATORY_CARE_PROVIDER_SITE_OTHER): Payer: Medicare Other | Admitting: Oncology

## 2022-08-09 ENCOUNTER — Inpatient Hospital Stay: Payer: Medicare Other | Attending: Oncology

## 2022-08-09 ENCOUNTER — Other Ambulatory Visit: Payer: Self-pay | Admitting: Oncology

## 2022-08-09 ENCOUNTER — Encounter: Payer: Self-pay | Admitting: Oncology

## 2022-08-09 DIAGNOSIS — Z923 Personal history of irradiation: Secondary | ICD-10-CM | POA: Insufficient documentation

## 2022-08-09 DIAGNOSIS — C153 Malignant neoplasm of upper third of esophagus: Secondary | ICD-10-CM | POA: Diagnosis present

## 2022-08-09 DIAGNOSIS — F17210 Nicotine dependence, cigarettes, uncomplicated: Secondary | ICD-10-CM

## 2022-08-09 DIAGNOSIS — Z8572 Personal history of non-Hodgkin lymphomas: Secondary | ICD-10-CM | POA: Insufficient documentation

## 2022-08-09 DIAGNOSIS — C159 Malignant neoplasm of esophagus, unspecified: Secondary | ICD-10-CM | POA: Insufficient documentation

## 2022-08-09 DIAGNOSIS — G893 Neoplasm related pain (acute) (chronic): Secondary | ICD-10-CM

## 2022-08-09 DIAGNOSIS — Z9221 Personal history of antineoplastic chemotherapy: Secondary | ICD-10-CM

## 2022-08-09 DIAGNOSIS — Z79899 Other long term (current) drug therapy: Secondary | ICD-10-CM | POA: Diagnosis not present

## 2022-08-09 LAB — CBC WITH DIFFERENTIAL (CANCER CENTER ONLY)
Abs Immature Granulocytes: 0.02 10*3/uL (ref 0.00–0.07)
Basophils Absolute: 0 10*3/uL (ref 0.0–0.1)
Basophils Relative: 0 %
Eosinophils Absolute: 0.1 10*3/uL (ref 0.0–0.5)
Eosinophils Relative: 1 %
HCT: 42.7 % (ref 39.0–52.0)
Hemoglobin: 15.1 g/dL (ref 13.0–17.0)
Immature Granulocytes: 0 %
Lymphocytes Relative: 24 %
Lymphs Abs: 1.1 10*3/uL (ref 0.7–4.0)
MCH: 31.8 pg (ref 26.0–34.0)
MCHC: 35.4 g/dL (ref 30.0–36.0)
MCV: 89.9 fL (ref 80.0–100.0)
Monocytes Absolute: 0.5 10*3/uL (ref 0.1–1.0)
Monocytes Relative: 10 %
Neutro Abs: 3 10*3/uL (ref 1.7–7.7)
Neutrophils Relative %: 65 %
Platelet Count: 186 10*3/uL (ref 150–400)
RBC: 4.75 MIL/uL (ref 4.22–5.81)
RDW: 12.5 % (ref 11.5–15.5)
WBC Count: 4.6 10*3/uL (ref 4.0–10.5)
nRBC: 0 % (ref 0.0–0.2)

## 2022-08-09 LAB — CMP (CANCER CENTER ONLY)
ALT: 29 U/L (ref 0–44)
AST: 53 U/L — ABNORMAL HIGH (ref 15–41)
Albumin: 3.5 g/dL (ref 3.5–5.0)
Alkaline Phosphatase: 51 U/L (ref 38–126)
Anion gap: 11 (ref 5–15)
BUN: 11 mg/dL (ref 8–23)
CO2: 24 mmol/L (ref 22–32)
Calcium: 9.3 mg/dL (ref 8.9–10.3)
Chloride: 99 mmol/L (ref 98–111)
Creatinine: 0.87 mg/dL (ref 0.61–1.24)
GFR, Estimated: >60 mL/min (ref >60)
Glucose, Bld: 124 mg/dL — ABNORMAL HIGH (ref 70–99)
Potassium: 4.4 mmol/L (ref 3.5–5.1)
Sodium: 134 mmol/L — ABNORMAL LOW (ref 135–145)
Total Bilirubin: 0.5 mg/dL (ref 0.3–1.2)
Total Protein: 7 g/dL (ref 6.5–8.1)

## 2022-08-09 MED ORDER — HYDROCODONE-ACETAMINOPHEN 10-325 MG/15ML PO SOLN: 15.0000 mL | Freq: Four times a day (QID) | ORAL | 0 refills | Status: DC | PRN

## 2022-08-09 NOTE — Progress Notes (Signed)
[place]  [address] [place],  [place]  [address] [phone]  Clinic Day:  08/09/2022  Referring physician: Raina Mina., MD   HISTORY OF PRESENT ILLNESS:  The patient is a 61 y.o. male  who I was asked to consult upon for newly diagnosed esophageal cancer.  Since September 2023, the patient has had progressive dysphagia.  It finally got to the point where he could no longer swallow any type of solid foods.  Over these past weeks, he has essentially been relegated to a liquid-only diet.  During this time, he lost over 12 pounds.  Furthermore, there have been episodes of odynophagia when he tried to swallow more solid foods.  An initial barium swallow was done, which showed an upper esophageal stricture that was highly concerning for malignancy.  This led to this gentleman undergoing an EGD in late November 2023, which revealed a mass in the upper third of his esophagus.  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  He comes in today to go over his biopsy results and their implications.  Of note, this gentleman admits to smoking as much as 2-1/2 packs of cigarettes daily for the past 49 years.  He has also consumed as many as 12 beers daily over numerous years.  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 This was followed by involved field radiation.  All of this was completed in April 2016.  Per review of his past radiation fields with radiation oncology today, his esophageal cancer is in the area where his diffuse large B-cell lymphoma was previously radiated.  PAST MEDICAL HISTORY:  Gastroesophageal reflux disease, hypertension, hypercholesterolemia, gout, pancreatitis, depression  PAST SURGICAL HISTORY:   Left breast lipoma resection, 2 back surgeries  CURRENT MEDICATIONS:   Current Outpatient  Medications  Medication Sig Dispense Refill   allopurinol (ZYLOPRIM) 100 MG tablet Take 1 tablet by mouth daily.     ALPRAZolam (XANAX) 1 MG tablet Take 0.5 mg by mouth daily as needed.     atenolol (TENORMIN) 100 MG tablet Take 1 tablet by mouth daily.     fenofibrate 160 MG tablet Take 160 mg by mouth daily.     levothyroxine (SYNTHROID) 100 MCG tablet Take 1 tablet by mouth daily.     omeprazole (PRILOSEC) 20 MG capsule Take 20 mg by mouth 2 (two) times daily before a meal.     oxyCODONE-acetaminophen (PERCOCET) 10-325 MG tablet Take 1 tablet by mouth every 4 (four) hours as needed.     HYDROcodone-Acetaminophen 10-325 MG/15ML SOLN Take 15 mLs by mouth every 6 (six) hours as needed. 450 mL 0   No current facility-administered medications for this visit.    ALLERGIES:   Allergies  Allergen Reactions   Levofloxacin Rash, Shortness Of Breath and Swelling   Codeine Hives and Nausea Only   Doxycycline Rash   Paxil [Paroxetine Hcl] Rash   Penicillin G Rash    FAMILY HISTORY:   Family History  Problem Relation Age of Onset   Diabetes Mother    Atrial fibrillation Mother    Asthma Mother    Diabetes Brother     SOCIAL HISTORY:  The patient was born and raised in [place].  He lives in the [place] community with his wife of 35 years.  They have 3 children.  He is  on disability due to back problems.  He formerly worked as a Administrator for 30 years.  His prominent smoking and drinking history are as mentioned per HPI.    REVIEW OF SYSTEMS:  Review of Systems  Constitutional:  Negative for fatigue, fever and unexpected weight change.  Respiratory:  Negative for chest tightness, cough, hemoptysis and shortness of breath.   Cardiovascular:  Negative for chest pain and palpitations.  Gastrointestinal:  Negative for abdominal distention, abdominal pain, blood in stool, constipation, diarrhea, nausea and vomiting.       Dysphagia and odynophagia are present  Genitourinary:   Negative for dysuria, frequency and hematuria.   Musculoskeletal:  Negative for arthralgias, back pain and myalgias.  Skin:  Negative for itching and rash.  Neurological:  Negative for dizziness, headaches and light-headedness.  Psychiatric/Behavioral:  Negative for depression and suicidal ideas. The patient is not nervous/anxious.      PHYSICAL EXAM:  Blood pressure 102/65, pulse 69, temperature 98.7 F (37.1 C), temperature source Oral, resp. rate 18, height '6\' 2"'$  (1.88 m), weight 182 lb (82.6 kg), SpO2 96 %. Wt Readings from Last 3 Encounters:  08/09/22 182 lb (82.6 kg)   Body mass index is 23.37 kg/m. Performance status (ECOG): 1 - Symptomatic but completely ambulatory Physical Exam Constitutional:      Appearance: Normal appearance. He is not ill-appearing.  HENT:     Mouth/Throat:     Mouth: Mucous membranes are moist.     Pharynx: Oropharynx is clear. No oropharyngeal exudate or posterior oropharyngeal erythema.  Cardiovascular:     Rate and Rhythm: Normal rate and regular rhythm.     Heart sounds: No murmur heard.    No friction rub. No gallop.  Pulmonary:     Effort: Pulmonary effort is normal. No respiratory distress.     Breath sounds: Normal breath sounds. No wheezing, rhonchi or rales.  Abdominal:     General: Bowel sounds are normal. There is no distension.     Palpations: Abdomen is soft. There is no mass.     Tenderness: There is no abdominal tenderness.  Musculoskeletal:        General: No swelling.     Right lower leg: No edema.     Left lower leg: No edema.  Lymphadenopathy:     Cervical: No cervical adenopathy.     Upper Body:     Right upper body: No supraclavicular or axillary adenopathy.     Left upper body: No supraclavicular or axillary adenopathy.     Lower Body: No right inguinal adenopathy. No left inguinal adenopathy.  Skin:    General: Skin is warm.     Coloration: Skin is not jaundiced.     Findings: No lesion or rash.  Neurological:      General: No focal deficit present.     Mental Status: He is alert and oriented to person, place, and time. Mental status is at baseline.  Psychiatric:        Mood and Affect: Mood normal.        Behavior: Behavior normal.        Thought Content: Thought content normal.    LABS:      Latest Ref Rng & Units 08/09/2022    3:40 PM 04/07/2009   11:50 AM 11/19/2008    5:20 AM  CBC  WBC 4.0 - 10.5 K/uL 4.6  8.7    Hemoglobin 13.0 - 17.0 g/dL 15.1  16.2  11.1   Hematocrit 39.0 -  52.0 % 42.7  47.8  31.6   Platelets 150 - 400 K/uL 186  183     STUDIES:  DG ESOPHAGUS W DOUBLE CM (HD)  Result Date: 07/31/2022 CLINICAL DATA:  DYSPHAGIA, UNSPECIFIED, HX OF LYMPHOMA AND RADIATION TO CHEST EXAM: ESOPHOGRAM / BARIUM SWALLOW / BARIUM TABLET STUDY TECHNIQUE: Combined double contrast and single contrast examination performed using effervescent crystals, thick barium liquid, and thin barium liquid. The patient was observed with fluoroscopy swallowing a 13 mm barium sulphate tablet. FLUOROSCOPY: Radiation Exposure Index (as provided by the fluoroscopic device): 1 minute (18.5 mGy) COMPARISON:  None Available. FINDINGS: Fluoroscopic evaluation of the pharynx during rapid sequence imaging with swallows of barium reveal no focal abnormalities. In the upper 1/3 of the esophagus, there is a 4 cm segment of mucosal irregularity with associated stricturing resulting in an apple-core appearance. The 13 mm barium tablet is noted to get stuck at this location. There is no evidence of hiatal hernia and no gastroesophageal reflux was seen during the examination. IMPRESSION: In the upper 1/3 of the esophagus, there is a 4 cm segment of mucosal irregularity with associated stricture corresponding to the patient's area of discomfort. The barium tablet is noted to get stuck at this location. Findings are concerning for esophageal malignancy. Other differential considerations include radiation fibrosis given history of chest  radiation. These results were called by EMR at the time of interpretation on 07/31/2022 at 12:00 pm to provider Arta Silence , who verbally acknowledged these results. Electronically Signed   By: Albin Felling M.D.   On: 07/31/2022 12:15     ASSESSMENT & PLAN:  A 61 y.o. 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  In clinic today, I went over his biopsy results with him, as well as their implications.  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  One of the standards of care for treating esophageal squamous cell carcinoma is concurrent chemoradiation.  However, with the radiation he has already received for his previous lymphoma that was in the same area, there is overall limit as the amount of radiation he can receive, if any more at all, in this area.  Other factors that will weigh in as to the type of treatment he will receive is both the local extent of his esophageal cancer, as well as if there is occult disease metastasis.  This gentleman will undergo a PET scan in the forthcoming weeks to rule out occult disease metastasis.  If this is not seen, then an endoscopic ultrasound of his esophageal cancer would then be done to assess the T stage of his cancer.  If the depth of his esophageal tumor is not particularly deep, then surgical resection of this lesion would be considered.  I will see this gentleman back within the next 2 weeks to go over his PET scan results, which will be used to formulate what next needs to be done with respect to his esophageal cancer workup.  With respect to the throat pain from his underlying cancer, I did prescribe this gentleman a hydrocodone/Tylenol elixir, which he will take 10/325 mg every 6 hours as needed.  The patient understands all the plans discussed today and is in agreement  with them.  I do appreciate Raina Mina., MD for his new consult.   Gurbani Figge Macarthur Critchley,  MD

## 2022-08-10 ENCOUNTER — Other Ambulatory Visit: Payer: Self-pay | Admitting: Oncology

## 2022-08-10 DIAGNOSIS — C153 Malignant neoplasm of upper third of esophagus: Secondary | ICD-10-CM

## 2022-08-10 MED ORDER — HYDROCODONE-ACETAMINOPHEN 7.5-325 MG/15ML PO SOLN: 15.0000 mL | Freq: Four times a day (QID) | ORAL | 0 refills | Status: AC | PRN

## 2022-08-15 NOTE — Progress Notes (Incomplete)
[facility]  [address] [place],  [name]  [address] [phone]  Clinic Day:  08/15/2022  Referring physician: Raina Mina., MD   HISTORY OF PRESENT ILLNESS:  The patient is a 61 y.o. male  who I was asked to consult upon for newly diagnosed esophageal cancer.  Since September 2023, the patient has had progressive dysphagia.  It finally got to the point where he could no longer swallow any type of solid foods.  Over these past weeks, he has essentially been relegated to a liquid-only diet.  During this time, he lost over 12 pounds.  Furthermore, there have been episodes of odynophagia when he tried to swallow more solid foods.  An initial barium swallow was done, which showed an upper esophageal stricture that was highly concerning for malignancy.  This led to this gentleman undergoing an EGD in late November 2023, which revealed a mass in the upper third of his esophagus.  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  He comes in today to go over his biopsy results and their implications.  Of note, this gentleman admits to smoking as much as 2-1/2 packs of cigarettes daily for the past 49 years.  He has also consumed as many as 12 beers daily over numerous years.  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 This was followed by involved field radiation.  All of this was completed in April 2016.  Per review of his past radiation fields with radiation oncology today, his esophageal cancer is in the area where his diffuse large B-cell lymphoma was previously radiated.  PAST MEDICAL HISTORY:  Gastroesophageal reflux disease, hypertension, hypercholesterolemia, gout, pancreatitis, depression  PAST SURGICAL HISTORY:   Left breast lipoma resection, 2 back surgeries  CURRENT MEDICATIONS:   Current Outpatient  Medications  Medication Sig Dispense Refill  . allopurinol (ZYLOPRIM) 100 MG tablet Take 1 tablet by mouth daily.    Marland Kitchen ALPRAZolam (XANAX) 1 MG tablet Take 0.5 mg by mouth daily as needed.    Marland Kitchen atenolol (TENORMIN) 100 MG tablet Take 1 tablet by mouth daily.    . fenofibrate 160 MG tablet Take 160 mg by mouth daily.    Marland Kitchen HYDROcodone-acetaminophen (HYCET) 7.5-325 mg/15 ml solution Take 15 mLs by mouth 4 (four) times daily as needed for moderate pain. 473 mL 0  . levothyroxine (SYNTHROID) 100 MCG tablet Take 1 tablet by mouth daily.    Marland Kitchen omeprazole (PRILOSEC) 20 MG capsule Take 20 mg by mouth 2 (two) times daily before a meal.    . oxyCODONE-acetaminophen (PERCOCET) 10-325 MG tablet Take 1 tablet by mouth every 4 (four) hours as needed.     No current facility-administered medications for this visit.    ALLERGIES:   Allergies  Allergen Reactions  . Levofloxacin Rash, Shortness Of Breath and Swelling  . Codeine Hives and Nausea Only  . Doxycycline Rash  . Paxil [Paroxetine Hcl] Rash  . Penicillin G Rash    FAMILY HISTORY:   Family History  Problem Relation Age of Onset  . Diabetes Mother   . Atrial fibrillation Mother   . Asthma Mother   . Diabetes Brother     SOCIAL HISTORY:  The patient was born and raised in [place].  He lives in the [place] community with his wife of 35 years.  They have  3 children.  He is on disability due to back problems.  He formerly worked as a Administrator for 30 years.  His prominent smoking and drinking history are as mentioned per HPI.    REVIEW OF SYSTEMS:  Review of Systems  Constitutional:  Negative for fatigue, fever and unexpected weight change.  Respiratory:  Negative for chest tightness, cough, hemoptysis and shortness of breath.   Cardiovascular:  Negative for chest pain and palpitations.  Gastrointestinal:  Negative for abdominal distention, abdominal pain, blood in stool, constipation, diarrhea, nausea and vomiting.        Dysphagia and odynophagia are present  Genitourinary:  Negative for dysuria, frequency and hematuria.   Musculoskeletal:  Negative for arthralgias, back pain and myalgias.  Skin:  Negative for itching and rash.  Neurological:  Negative for dizziness, headaches and light-headedness.  Psychiatric/Behavioral:  Negative for depression and suicidal ideas. The patient is not nervous/anxious.      PHYSICAL EXAM:  There were no vitals taken for this visit. Wt Readings from Last 3 Encounters:  08/09/22 182 lb (82.6 kg)   There is no height or weight on file to calculate BMI. Performance status (ECOG): 1 - Symptomatic but completely ambulatory Physical Exam Constitutional:      Appearance: Normal appearance. He is not ill-appearing.  HENT:     Mouth/Throat:     Mouth: Mucous membranes are moist.     Pharynx: Oropharynx is clear. No oropharyngeal exudate or posterior oropharyngeal erythema.  Cardiovascular:     Rate and Rhythm: Normal rate and regular rhythm.     Heart sounds: No murmur heard.    No friction rub. No gallop.  Pulmonary:     Effort: Pulmonary effort is normal. No respiratory distress.     Breath sounds: Normal breath sounds. No wheezing, rhonchi or rales.  Abdominal:     General: Bowel sounds are normal. There is no distension.     Palpations: Abdomen is soft. There is no mass.     Tenderness: There is no abdominal tenderness.  Musculoskeletal:        General: No swelling.     Right lower leg: No edema.     Left lower leg: No edema.  Lymphadenopathy:     Cervical: No cervical adenopathy.     Upper Body:     Right upper body: No supraclavicular or axillary adenopathy.     Left upper body: No supraclavicular or axillary adenopathy.     Lower Body: No right inguinal adenopathy. No left inguinal adenopathy.  Skin:    General: Skin is warm.     Coloration: Skin is not jaundiced.     Findings: No lesion or rash.  Neurological:     General: No focal deficit present.      Mental Status: He is alert and oriented to person, place, and time. Mental status is at baseline.  Psychiatric:        Mood and Affect: Mood normal.        Behavior: Behavior normal.        Thought Content: Thought content normal.   LABS:      Latest Ref Rng & Units 08/09/2022    3:40 PM 04/07/2009   11:50 AM 11/19/2008    5:20 AM  CBC  WBC 4.0 - 10.5 K/uL 4.6  8.7    Hemoglobin 13.0 - 17.0 g/dL 15.1  16.2  11.1   Hematocrit 39.0 - 52.0 % 42.7  47.8  31.6   Platelets 150 -  400 K/uL 186  183     STUDIES:  CT scans of his neck/chest/abdomen/pelvis revealed the following:  FINDINGS: Pharynx and larynx: Normal. No mass or swelling.  Salivary glands: No inflammation, mass, or stone.  Thyroid: Normal.  Lymph nodes: None enlarged or abnormal density.  Vascular: Negative.  Limited intracranial: Negative.  Visualized orbits: Negative.  Mastoids and visualized paranasal sinuses: Clear.  Skeleton: No acute or aggressive process.  Upper chest: Circumferential esophageal soft tissue thickening within the upper chest. This and other findings below the thoracic inlet are more completely characterized on the concomitant CT of the chest, abdomen and pelvis.  Other: None.  IMPRESSION: 1. No pharyngeal or laryngeal abnormality. 2. Circumferential esophageal soft tissue thickening within the upper chest. This and other findings below the thoracic inlet are more completely characterized on the concomitant CT of the chest, abdomen and pelvis.  FINDINGS: CT CHEST FINDINGS  Cardiovascular: The cardiac size is normal. There is no pericardial effusion. There are scattered calcifications in the LAD and right coronary arteries. The pulmonary arteries and veins are normal caliber and the pulmonary arteries are centrally clear. There is mild aortic and great vessel atherosclerosis without stenosis, aneurysm or dissection.  Mediastinum/Nodes: Unremarkable thyroid gland and axillary  spaces. There is a 6 cm length of the thoracic esophagus from about T2-3 to the carina, showing masslike circumferential wall thickening with irregular luminal surface with scattered ulcerative changes.  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  The subcarinal esophagus has a normal thickness. Remainder of the trachea is unremarkable.  In the AP window, 2 lymph nodes are noted, 1 along side the mid left main bronchus measuring 8.4 mm in short axis, the other anterior to this measuring 6.4 mm in short axis. Both were slightly smaller previously.  There are calcified right paratracheal and subcarinal lymph nodes consistent with treated adenopathy, a right hilar lymph node on 6:26 measuring borderline at 8.4 mm.  No other significant intrathoracic lymph nodes are seen.  Lungs/Pleura: There are mild features of paramediastinal fibrosis in the right upper lobe which are probably due to XRT. There is diffuse bronchial thickening. There are mild paraseptal emphysematous changes lung apices.  The lungs are clear of acute infiltrates and nodules. There is no pleural effusion, thickening or pneumothorax.  Musculoskeletal: No chest wall mass or destructive bone lesion is seen. No gynecomastia.  CT ABDOMEN PELVIS FINDINGS  Hepatobiliary: The liver is 19 cm length, mildly steatotic with no mass enhancement. The gallbladder and bile ducts are unremarkable.  Pancreas: Coarse calcifications are again noted along the pancreatic tail . There is new demonstration of a 2.2 x 1.9 cm homogeneous thin walled cystic pancreatic tail lesion, Hounsfield density of 19 alongside the calcifications. In the delayed phase there is no significant change in density of this. There is no solid mass enhancement, ductal dilatation or  inflammatory change.  Spleen: Mildly prominent, 14 cm length. No mass.  Adrenals/Urinary Tract: There is no adrenal mass. There is homogeneous right renal cortical enhancement. There are 2 tiny hypodensities in the left renal cortex which are too small to characterize.  Most likely these are tiny cysts. No follow-up imaging is recommended. For reference see JACR 2018 Feb; 264-273, Management of the Incidental Renal Mass on CT, Radio[employer] 2021; 814-848, Bosniak Classification of Cystic Renal Masses, Version 2019.  There is no urinary stone or obstruction. There is no bladder thickening.  Stomach/Bowel: There are chronic thickened folds in the stomach. Probable chronic gastritis. The unopacified small bowel is unremarkable. The appendix is normal caliber with stones or contrast in the distal lumen. There is moderate stool retention ascending colon, proximal transverse colon, mild fecal stasis in the remainder. No evidence of colitis or diverticulitis.  Vascular/Lymphatic: Aortic atherosclerosis is similar to 2016. There is no AAA. There is no abdominal, pelvic or inguinal adenopathy.  Reproductive: Within normal limits prostatic size.  Other: No free air, hemorrhage or fluid. There are small inguinal fat hernias.  Musculoskeletal: L4-5 bilateral posterior rod and pedicle screw fusion construct with left hemilaminectomy. This was seen previously, with solid arthrodesis. There are no destructive regional bone lesions.  IMPRESSION: 1. 6 cm length of the thoracic esophagus with masslike circumferential wall thickening, irregular luminal surface, and scattered ulcerative changes. 2. 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 3. Two AP window lymph nodes, 1 along side the mid left main bronchus measuring 8.4 mm in short axis, the other anterior to this measuring 6.4 mm in short axis. Both were slightly  smaller previously. Borderline right hilar lymph node measuring 8.4 mm in short axis. 4. COPD and mild paramediastinal fibrosis in the right upper lobe. 5. Aortic and coronary artery atherosclerosis. 6. Mildly prominent liver with mild steatosis. Mild splenomegaly. 7. 2.2 x 1.9 cm homogeneous thin walled cystic pancreatic tail lesion, new since the 2016 study. There are adjacent chronic coarse calcifications in the pancreatic tail. This could be due to chronic pancreatitis with a pseudocyst, interval development of an IPMN, or a cystic neoplasm. Follow-up MRI without and with contrast recommended. 8. Constipation. 9. Small inguinal fat hernias. 10. Chronic thickened folds in the stomach. Probable chronic gastritis. 11. Emphysema.  Aortic Atherosclerosis (ICD10-I70.0) and Emphysema (ICD10-J43.9).  ASSESSMENT & PLAN:  A 61 y.o. 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  In clinic today, I went over his biopsy results with him, as well as their implications.  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  One of the standards of care for treating esophageal squamous cell carcinoma is concurrent chemoradiation.  However, with the radiation he has already received for his previous lymphoma that was in the same area, there is overall limit as the amount of radiation he can receive, if any more at all, in this area.  Other factors that will weigh in as to the type of treatment he will receive is both the local extent of his esophageal cancer, as well as if there is occult disease metastasis.  This gentleman will undergo a PET scan in the forthcoming weeks to rule out occult disease metastasis.  If this is not seen, then an endoscopic ultrasound of his esophageal cancer would then be done to assess the T stage of  his cancer.  If the depth of his esophageal tumor is not particularly deep, then surgical resection of this lesion would be considered.  I will see this gentleman back within the next 2 weeks to go over his PET scan results, which will be used to formulate what next needs to be done with respect to his esophageal cancer workup.  With respect to the throat  pain from his underlying cancer, I did prescribe this gentleman a hydrocodone/Tylenol elixir, which he will take 10/325 mg every 6 hours as needed.  The patient understands all the plans discussed today and is in agreement with them.  I do appreciate Raina Mina., MD for his new consult.   Amelie Caracci Macarthur Critchley, MD

## 2022-08-16 ENCOUNTER — Inpatient Hospital Stay: Payer: Medicare Other | Admitting: Oncology

## 2022-08-16 ENCOUNTER — Other Ambulatory Visit: Payer: Self-pay

## 2022-08-16 ENCOUNTER — Encounter (HOSPITAL_COMMUNITY): Payer: Self-pay

## 2022-08-16 ENCOUNTER — Ambulatory Visit: Payer: Medicare Other

## 2022-08-16 DIAGNOSIS — C153 Malignant neoplasm of upper third of esophagus: Secondary | ICD-10-CM

## 2022-08-17 ENCOUNTER — Encounter: Payer: Self-pay | Admitting: Licensed Clinical Social Worker

## 2022-08-17 DIAGNOSIS — C153 Malignant neoplasm of upper third of esophagus: Secondary | ICD-10-CM

## 2022-08-17 NOTE — Progress Notes (Signed)
[facility] Clinical Social Work  Clinical Social Work was referred by medical provider for assessment of psychosocial needs.  Clinical Social Worker attempted to contact patient by phone  to offer support and assess for needs.  CSW left voicemail with contact information and request for a return call.   FA  Coley Kulikowski, LCSW  Clinical Social Worker  [facility]          

## 2022-08-23 ENCOUNTER — Other Ambulatory Visit: Payer: Medicare Other

## 2022-08-29 ENCOUNTER — Ambulatory Visit (HOSPITAL_COMMUNITY): Payer: Medicare Other

## 2022-08-30 ENCOUNTER — Ambulatory Visit: Payer: Medicare Other | Admitting: Oncology

## 2022-08-31 ENCOUNTER — Ambulatory Visit: Payer: Medicare Other | Admitting: Oncology

## 2022-09-06 ENCOUNTER — Telehealth: Payer: Self-pay | Admitting: Oncology

## 2022-09-06 ENCOUNTER — Other Ambulatory Visit: Payer: Self-pay | Admitting: Oncology

## 2022-09-06 DIAGNOSIS — C153 Malignant neoplasm of upper third of esophagus: Secondary | ICD-10-CM

## 2022-09-06 NOTE — Progress Notes (Signed)
[facility]  [address] [place],  [place]  [address] [phone]  Clinic Day:  09/08/2022  Referring physician: Gordan Payment., MD   HISTORY OF PRESENT ILLNESS:  The patient is a 62 y.o. male  with squamous cell esophageal cancer.  He comes back in today after recently being discharged from the hospital again for complications related to his esophageal cancer.  Since his last visit, he had to be hospitalized at [facility] due to developing a T-E fistula.  A stent was placed to close off the fistula.  A feeding tube was also placed to ensure there could be another route by which his nutrition/caloric intake could be maintained.  Unfortunately, aspiration remained an issue which required him to be re-hospitalized.  He comes in today after just being discharged 2 days ago.  He is noticeably weaker since his last visit.  He has also lost a considerable amount of weight.   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 This was followed by involved field radiation.  All of this was completed in April 2016.  Per review of his past radiation fields with radiation oncology today, his esophageal cancer is in the area where his diffuse large B-cell lymphoma was previously radiated, thus making additional radiation to this area for his esophageal cancer virtually impossible.    PHYSICAL EXAM:  Blood pressure 132/84, pulse 93, temperature 98.7 F (37.1 C), resp. rate 18, height 6\' 2"  (1.88 m), weight 169 lb 8 oz (76.9 kg), SpO2 92 %. Wt Readings from Last 3 Encounters:  09/07/22 169 lb 8 oz (76.9 kg)  08/09/22 182 lb (82.6 kg)   Body mass index is 21.76 kg/m. Performance status (ECOG): 1 - Symptomatic but completely ambulatory Physical Exam Constitutional:      Appearance: He is not ill-appearing.     Comments: He is noticeably thinner and weaker vs previous visits.   HENT:     Mouth/Throat:     Mouth: Mucous membranes are moist.     Pharynx: Oropharynx is clear. No oropharyngeal exudate or posterior oropharyngeal erythema.  Cardiovascular:     Rate and Rhythm: Normal rate and regular rhythm.     Heart sounds: No murmur heard.    No friction rub. No gallop.  Pulmonary:     Effort: Pulmonary effort is normal. No respiratory distress.     Breath sounds: Normal breath sounds. No wheezing, rhonchi or rales.  Abdominal:     General: Bowel sounds are normal. There is no distension.     Palpations: Abdomen is soft. There is no mass.     Tenderness: There is no abdominal tenderness.  Musculoskeletal:        General: No swelling.     Right lower leg: No edema.     Left lower leg: No edema.  Lymphadenopathy:     Cervical: No cervical adenopathy.     Upper Body:     Right upper body: No supraclavicular or axillary adenopathy.     Left upper body: No supraclavicular or axillary adenopathy.     Lower Body: No right inguinal adenopathy. No left inguinal adenopathy.  Skin:    General: Skin is warm.     Coloration: Skin is not jaundiced.     Findings: No lesion or rash.  Neurological:     General: No focal deficit present.     Mental  Status: He is alert and oriented to person, place, and time. Mental status is at baseline.  Psychiatric:        Mood and Affect: Mood normal.        Behavior: Behavior normal.        Thought Content: Thought content normal.   LABS:      Latest Ref Rng & Units 09/07/2022    4:07 PM 08/09/2022    3:40 PM 04/07/2009   11:50 AM  CBC  WBC 4.0 - 10.5 K/uL 8.8  4.6  8.7   Hemoglobin 13.0 - 17.0 g/dL 54.0  98.1  19.1   Hematocrit 39.0 - 52.0 % 35.6  42.7  47.8   Platelets 150 - 400 K/uL 377  186  183    ASSESSMENT & PLAN:  A 62 y.o. 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  This gentleman's case is particularly difficult as there appear to be no viable treatment options for him.  He is  clinically too ill to undergo an esophagectomy.  He cannot undergo definitive chemoradiation due to the fact that his cancer is in the same area his previous lymphoma was.  The maximum lifetime amount of radiation he can receive to this area has already been reached, per radiation oncology.  Unfortunately, it appears only a palliative approach can be taken moving forward.  Ultimately, hospice may need to be brought in for comfort measures.  The patient understands the tough discussions held today.  I will see him back in approximately one month to see how he is doing.  He knows to contact our office before then if he has additional needs that we can address.     Charizma Gardiner Kirby Funk, MD

## 2022-09-06 NOTE — Telephone Encounter (Signed)
Contacted pt to schedule an appt. Unable to reach via phone.     LABS / FOLLOW UP Received: Today Velora Heckler, CMA  P [facility] Scheduling PLEASE CALL PATIENT AND SCHEDULE FOR SOMETIME NEXT WEEK FOR LABS AND FOLLOW UP WITH LEWIS.  THANKS

## 2022-09-07 ENCOUNTER — Other Ambulatory Visit: Payer: Self-pay | Admitting: Oncology

## 2022-09-07 ENCOUNTER — Inpatient Hospital Stay: Payer: Medicare Other | Admitting: Licensed Clinical Social Worker

## 2022-09-07 ENCOUNTER — Inpatient Hospital Stay: Payer: Medicare Other

## 2022-09-07 ENCOUNTER — Inpatient Hospital Stay: Payer: Medicare Other | Attending: Oncology | Admitting: Oncology

## 2022-09-07 VITALS — BP 132/84 | HR 93 | Temp 98.7°F | Resp 18 | Ht 74.0 in | Wt 169.5 lb

## 2022-09-07 DIAGNOSIS — C153 Malignant neoplasm of upper third of esophagus: Secondary | ICD-10-CM

## 2022-09-07 DIAGNOSIS — F17210 Nicotine dependence, cigarettes, uncomplicated: Secondary | ICD-10-CM | POA: Insufficient documentation

## 2022-09-07 LAB — CBC WITH DIFFERENTIAL (CANCER CENTER ONLY)
Abs Immature Granulocytes: 0.05 10*3/uL (ref 0.00–0.07)
Basophils Absolute: 0.1 10*3/uL (ref 0.0–0.1)
Basophils Relative: 1 %
Eosinophils Absolute: 0.2 10*3/uL (ref 0.0–0.5)
Eosinophils Relative: 2 %
HCT: 35.6 % — ABNORMAL LOW (ref 39.0–52.0)
Hemoglobin: 11.6 g/dL — ABNORMAL LOW (ref 13.0–17.0)
Immature Granulocytes: 1 %
Lymphocytes Relative: 18 %
Lymphs Abs: 1.6 10*3/uL (ref 0.7–4.0)
MCH: 31.2 pg (ref 26.0–34.0)
MCHC: 32.6 g/dL (ref 30.0–36.0)
MCV: 95.7 fL (ref 80.0–100.0)
Monocytes Absolute: 0.8 10*3/uL (ref 0.1–1.0)
Monocytes Relative: 9 %
Neutro Abs: 6.2 10*3/uL (ref 1.7–7.7)
Neutrophils Relative %: 69 %
Platelet Count: 377 10*3/uL (ref 150–400)
RBC: 3.72 MIL/uL — ABNORMAL LOW (ref 4.22–5.81)
RDW: 14 % (ref 11.5–15.5)
WBC Count: 8.8 10*3/uL (ref 4.0–10.5)
nRBC: 0 % (ref 0.0–0.2)

## 2022-09-07 LAB — CMP (CANCER CENTER ONLY)
ALT: 21 U/L (ref 0–44)
AST: 25 U/L (ref 15–41)
Albumin: 3.4 g/dL — ABNORMAL LOW (ref 3.5–5.0)
Alkaline Phosphatase: 148 U/L — ABNORMAL HIGH (ref 38–126)
Anion gap: 7 (ref 5–15)
BUN: 18 mg/dL (ref 8–23)
CO2: 30 mmol/L (ref 22–32)
Calcium: 9.6 mg/dL (ref 8.9–10.3)
Chloride: 98 mmol/L (ref 98–111)
Creatinine: 0.55 mg/dL — ABNORMAL LOW (ref 0.61–1.24)
GFR, Estimated: >60 mL/min (ref >60)
Glucose, Bld: 139 mg/dL — ABNORMAL HIGH (ref 70–99)
Potassium: 4.1 mmol/L (ref 3.5–5.1)
Sodium: 135 mmol/L (ref 135–145)
Total Bilirubin: 0.6 mg/dL (ref 0.3–1.2)
Total Protein: 7.7 g/dL (ref 6.5–8.1)

## 2022-09-07 MED ORDER — PREDNISONE 10 MG PO TABS: ORAL_TABLET | ORAL | 0 refills | Status: AC

## 2022-09-07 MED ORDER — AZITHROMYCIN 500 MG PO TABS: 500.0000 mg | ORAL_TABLET | Freq: Every day | ORAL | 0 refills | Status: AC

## 2022-09-07 MED ORDER — OXYCODONE HCL 10 MG PO TABS: ORAL_TABLET | ORAL | 0 refills | Status: AC

## 2022-09-07 MED ORDER — BENZONATATE 100 MG PO CAPS: 100.0000 mg | ORAL_CAPSULE | Freq: Three times a day (TID) | ORAL | 1 refills | Status: AC

## 2022-09-07 NOTE — Progress Notes (Signed)
[facility] Work  Initial Assessment   [name] is a 62 y.o. year old male contacted by phone. Clinical Social Work was referred by medical provider for assessment of psychosocial needs.   SDOH (Social Determinants of Health) assessments performed: Yes SDOH Interventions    Flowsheet Row Clinical Support from 09/07/2022 in [place] at [place] Interventions Intervention Not Indicated  Housing Interventions Intervention Not Indicated  Transportation Interventions Patient Resources (Friends/Family), Intervention Not Indicated  Utilities Interventions Intervention Not Indicated  Alcohol Usage Interventions Intervention Not Indicated (Score <7)  Financial Strain Interventions Intervention Not Indicated  Physical Activity Interventions Intervention Not Indicated  Stress Interventions Intervention Not Indicated, Provide Counseling  Social Connections Interventions Intervention Not Indicated       SDOH Screenings   Food Insecurity: No Food Insecurity (09/07/2022)  Housing: Low Risk  (09/07/2022)  Transportation Needs: No Transportation Needs (09/07/2022)  Utilities: Not At Risk (09/07/2022)  Alcohol Screen: Low Risk  (09/07/2022)  Depression (PHQ2-9): Low Risk  (09/07/2022)  Financial Resource Strain: Low Risk  (09/07/2022)  Physical Activity: Inactive (09/07/2022)  Social Connections: Socially Integrated (09/07/2022)  Stress: Stress Concern Present (09/07/2022)  Tobacco Use: High Risk (08/09/2022)     Distress Screen completed: No     No data to display            Family/Social Information:  Housing Arrangement: patient lives with spouse, main contact  [name] [phone]  Family members/support persons in your life? Family and Medical Staff Transportation concerns: no  Employment: Disabled  .  Income source: Banker concerns:  No, immediate concerns present, would like to apply  for [employer] assistance Type of concern:  no current concerns present Food access concerns: no Religious or spiritual practice: Not known Services Currently in place:  [facility] Medicare, Disability  Coping/ Adjustment to diagnosis: Patient understands treatment plan and what happens next? yes Concerns about diagnosis and/or treatment: I'm not especially worried about anything Patient reported stressors: Physical issues Hopes and/or priorities: N/A Patient enjoys time with family/ friends Current coping skills/ strengths: Average or above average intelligence , Capable of independent living , Communication skills , Scientist, research (life sciences) , Motivation for treatment/growth , Physical Health , and Supportive family/friends     SUMMARY: Current SDOH Barriers:  Financial constraints related to fixed income and ADL IADL limitations  Clinical Social Work Clinical Goal(s):  No clinical social work goals at this time  Interventions: Discussed common feeling and emotions when being diagnosed with cancer, and the importance of support during treatment Informed patient of the support team roles and support services at [facility] Provided [place] contact information and encouraged patient to call with any questions or concerns Referred patient to [employer] and Provided patient with information about CSW role in patient care and other available resources.   Follow Up Plan: Patient will contact CSW with any support or resource needs Patient verbalizes understanding of plan: Yes    Nilton Lave, LCSW

## 2022-09-11 NOTE — Progress Notes (Signed)
Spoke with patients wife regarding the [employer], they will be in on 09/20/2022 and we will plan on meeting then.

## 2022-09-19 DIAGNOSIS — C153 Malignant neoplasm of upper third of esophagus: Secondary | ICD-10-CM | POA: Diagnosis not present

## 2022-09-20 ENCOUNTER — Ambulatory Visit: Payer: Medicare Other | Admitting: Oncology

## 2022-09-20 NOTE — Progress Notes (Signed)
Enrolled patient into [employer]

## 2022-10-04 DEATH — deceased
